# Patient Record
Sex: Female | Born: 1935 | Race: White | Hispanic: No | Marital: Married | Smoking: Never smoker
Health system: Southern US, Community
[De-identification: ages and names within clinical notes are randomized; demographics above are authoritative.]

## PROBLEM LIST (undated history)

## (undated) DIAGNOSIS — K5792 Diverticulitis of intestine, part unspecified, without perforation or abscess without bleeding: Secondary | ICD-10-CM

## (undated) DIAGNOSIS — G459 Transient cerebral ischemic attack, unspecified: Secondary | ICD-10-CM

## (undated) DIAGNOSIS — I739 Peripheral vascular disease, unspecified: Secondary | ICD-10-CM

## (undated) DIAGNOSIS — H4450 Unspecified degenerated conditions of globe: Secondary | ICD-10-CM

## (undated) DIAGNOSIS — I10 Essential (primary) hypertension: Secondary | ICD-10-CM

## (undated) DIAGNOSIS — H9190 Unspecified hearing loss, unspecified ear: Secondary | ICD-10-CM

## (undated) DIAGNOSIS — M810 Age-related osteoporosis without current pathological fracture: Secondary | ICD-10-CM

## (undated) HISTORY — PX: OTHER SURGICAL HISTORY: SHX169

---

## 2004-04-08 ENCOUNTER — Ambulatory Visit: Payer: Self-pay

## 2004-10-01 ENCOUNTER — Ambulatory Visit: Payer: Self-pay | Admitting: Internal Medicine

## 2004-10-07 ENCOUNTER — Ambulatory Visit: Payer: Self-pay | Admitting: Internal Medicine

## 2005-10-04 ENCOUNTER — Ambulatory Visit: Payer: Self-pay | Admitting: Internal Medicine

## 2006-10-07 ENCOUNTER — Ambulatory Visit: Payer: Self-pay | Admitting: Internal Medicine

## 2007-06-01 ENCOUNTER — Emergency Department: Payer: Self-pay | Admitting: Internal Medicine

## 2007-06-02 ENCOUNTER — Ambulatory Visit: Payer: Self-pay

## 2007-07-07 ENCOUNTER — Other Ambulatory Visit: Payer: Self-pay

## 2007-07-07 ENCOUNTER — Inpatient Hospital Stay: Payer: Self-pay | Admitting: Internal Medicine

## 2007-10-09 ENCOUNTER — Ambulatory Visit: Payer: Self-pay | Admitting: Internal Medicine

## 2008-11-04 ENCOUNTER — Ambulatory Visit: Payer: Self-pay | Admitting: Internal Medicine

## 2010-12-29 ENCOUNTER — Encounter: Payer: Self-pay | Admitting: Cardiothoracic Surgery

## 2010-12-29 ENCOUNTER — Encounter: Payer: Self-pay | Admitting: Nurse Practitioner

## 2011-01-06 ENCOUNTER — Encounter: Payer: Self-pay | Admitting: Nurse Practitioner

## 2011-01-06 ENCOUNTER — Encounter: Payer: Self-pay | Admitting: Cardiothoracic Surgery

## 2011-02-06 ENCOUNTER — Encounter: Payer: Self-pay | Admitting: Cardiothoracic Surgery

## 2011-02-06 ENCOUNTER — Encounter: Payer: Self-pay | Admitting: Nurse Practitioner

## 2011-07-05 ENCOUNTER — Ambulatory Visit: Payer: Self-pay | Admitting: Internal Medicine

## 2012-06-29 ENCOUNTER — Encounter: Payer: Self-pay | Admitting: Cardiothoracic Surgery

## 2012-06-29 ENCOUNTER — Encounter: Payer: Self-pay | Admitting: Nurse Practitioner

## 2012-07-08 ENCOUNTER — Encounter: Payer: Self-pay | Admitting: Cardiothoracic Surgery

## 2012-07-08 ENCOUNTER — Encounter: Payer: Self-pay | Admitting: Nurse Practitioner

## 2012-07-30 LAB — MISC AER/ANAEROBIC CULT.

## 2012-08-05 ENCOUNTER — Encounter: Payer: Self-pay | Admitting: Nurse Practitioner

## 2012-08-05 ENCOUNTER — Encounter: Payer: Self-pay | Admitting: Cardiothoracic Surgery

## 2012-09-05 ENCOUNTER — Encounter: Payer: Self-pay | Admitting: Nurse Practitioner

## 2012-09-05 ENCOUNTER — Encounter: Payer: Self-pay | Admitting: Cardiothoracic Surgery

## 2012-09-19 LAB — COMPREHENSIVE METABOLIC PANEL
Albumin: 3.2 g/dL — ABNORMAL LOW (ref 3.4–5.0)
Alkaline Phosphatase: 60 U/L (ref 50–136)
Calcium, Total: 9.1 mg/dL (ref 8.5–10.1)
Co2: 25 mmol/L (ref 21–32)
EGFR (African American): >60
Glucose: 95 mg/dL (ref 65–99)
Osmolality: 270 (ref 275–301)
SGOT(AST): 27 U/L (ref 15–37)
SGPT (ALT): 19 U/L (ref 12–78)
Sodium: 136 mmol/L (ref 136–145)

## 2012-09-19 LAB — URINALYSIS, COMPLETE
Blood: NEGATIVE
Glucose,UR: NEGATIVE mg/dL (ref 0–75)
Hyaline Cast: 3
Protein: 30
RBC,UR: 8 /HPF (ref 0–5)
Squamous Epithelial: 11
WBC UR: 7 /HPF (ref 0–5)

## 2012-09-19 LAB — CBC WITH DIFFERENTIAL/PLATELET
Basophil %: 0.7 %
Eosinophil #: 0 10*3/uL (ref 0.0–0.7)
Lymphocyte %: 20.7 %
MCHC: 34 g/dL (ref 32.0–36.0)
Neutrophil #: 3.3 10*3/uL (ref 1.4–6.5)
Platelet: 119 10*3/uL — ABNORMAL LOW (ref 150–440)
RBC: 4.11 10*6/uL (ref 3.80–5.20)
WBC: 5.1 10*3/uL (ref 3.6–11.0)

## 2012-09-19 LAB — PROTIME-INR
INR: 2.1
Prothrombin Time: 23.2 secs — ABNORMAL HIGH (ref 11.5–14.7)

## 2012-09-19 LAB — TROPONIN I: Troponin-I: 0.02 ng/mL

## 2012-09-19 LAB — TSH: Thyroid Stimulating Horm: 3.38 u[IU]/mL

## 2012-09-20 LAB — COMPREHENSIVE METABOLIC PANEL
Alkaline Phosphatase: 56 U/L (ref 50–136)
Anion Gap: 6 — ABNORMAL LOW (ref 7–16)
BUN: 8 mg/dL (ref 7–18)
Bilirubin,Total: 0.7 mg/dL (ref 0.2–1.0)
Calcium, Total: 8.7 mg/dL (ref 8.5–10.1)
Co2: 28 mmol/L (ref 21–32)
Creatinine: 0.88 mg/dL (ref 0.60–1.30)
EGFR (African American): >60
Glucose: 85 mg/dL (ref 65–99)
Osmolality: 268 (ref 275–301)
Potassium: 4 mmol/L (ref 3.5–5.1)
SGOT(AST): 32 U/L (ref 15–37)
SGPT (ALT): 22 U/L (ref 12–78)
Sodium: 135 mmol/L — ABNORMAL LOW (ref 136–145)

## 2012-09-20 LAB — CBC WITH DIFFERENTIAL/PLATELET
Basophil %: 0.5 %
Eosinophil #: 0 10*3/uL (ref 0.0–0.7)
Eosinophil %: 0.3 %
HGB: 13.5 g/dL (ref 12.0–16.0)
Lymphocyte #: 0.7 10*3/uL — ABNORMAL LOW (ref 1.0–3.6)
MCH: 32.5 pg (ref 26.0–34.0)
MCV: 96 fL (ref 80–100)
Monocyte #: 0.8 x10 3/mm (ref 0.2–0.9)
Neutrophil #: 2.4 10*3/uL (ref 1.4–6.5)
Platelet: 113 10*3/uL — ABNORMAL LOW (ref 150–440)
RBC: 4.16 10*6/uL (ref 3.80–5.20)
RDW: 14.1 % (ref 11.5–14.5)
WBC: 3.9 10*3/uL (ref 3.6–11.0)

## 2012-09-21 ENCOUNTER — Inpatient Hospital Stay: Payer: Self-pay | Admitting: Internal Medicine

## 2012-09-21 LAB — PROTIME-INR
INR: 1.6
Prothrombin Time: 18.6 secs — ABNORMAL HIGH (ref 11.5–14.7)

## 2012-09-21 LAB — URINE CULTURE

## 2012-09-22 LAB — PROTIME-INR: Prothrombin Time: 17.2 secs — ABNORMAL HIGH (ref 11.5–14.7)

## 2012-09-23 LAB — CBC WITH DIFFERENTIAL/PLATELET
Basophil #: 0 10*3/uL (ref 0.0–0.1)
Basophil %: 0.8 %
HCT: 41.3 % (ref 35.0–47.0)
HGB: 14.2 g/dL (ref 12.0–16.0)
Lymphocyte #: 0.9 10*3/uL — ABNORMAL LOW (ref 1.0–3.6)
Lymphocyte %: 26.9 %
Monocyte #: 0.5 x10 3/mm (ref 0.2–0.9)
RBC: 4.37 10*6/uL (ref 3.80–5.20)

## 2012-09-23 LAB — BASIC METABOLIC PANEL
Anion Gap: 8 (ref 7–16)
BUN: 11 mg/dL (ref 7–18)
Chloride: 105 mmol/L (ref 98–107)
Co2: 27 mmol/L (ref 21–32)
Creatinine: 0.84 mg/dL (ref 0.60–1.30)
EGFR (Non-African Amer.): >60
Glucose: 104 mg/dL — ABNORMAL HIGH (ref 65–99)
Potassium: 3.4 mmol/L — ABNORMAL LOW (ref 3.5–5.1)

## 2012-09-23 LAB — PROTIME-INR: Prothrombin Time: 20.7 secs — ABNORMAL HIGH (ref 11.5–14.7)

## 2012-09-24 LAB — BASIC METABOLIC PANEL
Anion Gap: 6 — ABNORMAL LOW (ref 7–16)
BUN: 8 mg/dL (ref 7–18)
Chloride: 106 mmol/L (ref 98–107)
Creatinine: 0.74 mg/dL (ref 0.60–1.30)
EGFR (African American): >60
EGFR (Non-African Amer.): >60
Glucose: 85 mg/dL (ref 65–99)
Osmolality: 273 (ref 275–301)
Potassium: 3.6 mmol/L (ref 3.5–5.1)
Sodium: 138 mmol/L (ref 136–145)

## 2012-09-24 LAB — PROTIME-INR
INR: 2.5
Prothrombin Time: 26.6 secs — ABNORMAL HIGH (ref 11.5–14.7)

## 2012-09-24 LAB — CBC WITH DIFFERENTIAL/PLATELET
Basophil #: 0 10*3/uL (ref 0.0–0.1)
Basophil %: 0.8 %
HGB: 13.8 g/dL (ref 12.0–16.0)
Lymphocyte #: 1 10*3/uL (ref 1.0–3.6)
Lymphocyte %: 38.2 %
MCHC: 33.9 g/dL (ref 32.0–36.0)
MCV: 94 fL (ref 80–100)
Monocyte #: 0.4 x10 3/mm (ref 0.2–0.9)
Neutrophil #: 1.1 10*3/uL — ABNORMAL LOW (ref 1.4–6.5)
Neutrophil %: 40 %
Platelet: 123 10*3/uL — ABNORMAL LOW (ref 150–440)
RBC: 4.33 10*6/uL (ref 3.80–5.20)
RDW: 14 % (ref 11.5–14.5)
WBC: 2.7 10*3/uL — ABNORMAL LOW (ref 3.6–11.0)

## 2012-09-25 LAB — PROTIME-INR
INR: 3.6
Prothrombin Time: 34.7 secs — ABNORMAL HIGH (ref 11.5–14.7)

## 2012-09-25 LAB — CULTURE, BLOOD (SINGLE)

## 2012-09-26 LAB — PROTIME-INR: Prothrombin Time: 36.9 secs — ABNORMAL HIGH (ref 11.5–14.7)

## 2013-10-17 ENCOUNTER — Encounter: Payer: Self-pay | Admitting: Surgery

## 2013-11-05 ENCOUNTER — Encounter: Payer: Self-pay | Admitting: Surgery

## 2013-11-12 ENCOUNTER — Encounter: Payer: Self-pay | Admitting: Surgery

## 2014-09-27 NOTE — Discharge Summary (Signed)
PATIENT NAME:  Ashley Todd, Ashley Todd MR#:  [id number] DATE OF BIRTH:  06-04-1919  DATE OF ADMISSION:  09/21/2012 DATE OF DISCHARGE:  09/26/2012  DISCHARGE DIAGNOSES: 1.  Encephalopathy, secondary to urinary tract infection.  2.  Chronic atrial fibrillation.  3.  Osteoporosis.  4.  History of embolic stroke.  5.  Urinary incontinence.   DISCHARGE MEDICATIONS: 1.  Coumadin 2 mg at bedtime.  2.  Digoxin 125 mcg every other day.  3.  Tylenol 650 mg q.4 p.r.n., pain.  4.  Vitamin D3 1000 units daily.  5.  Zantac 150 mg b.i.d.   REASON FOR ADMISSION: A 81 year old female presents with altered mental status and a urinary tract infection. Please see H and P for HPI, past medical history, and physical exam.   HOSPITAL COURSE: The patient was admitted. MRI was performed which showed her old stroke, but not a new stroke. She was given IV Rocephin for a UTI. Over time her mentation improved somewhat. She does have a baseline mild dementia, which was worsened being in the hospital setting. She remained somewhat weak and unstable. Her nausea improved by going to q.o.d. on the digoxin and stopping her atenolol. She does have some reflux, treated with Zantac. She will be going to a skilled nursing facility. Overall prognosis is guarded.   Currently her INR is 2.9 on 5 mg of Coumadin. Coumadin is being held. Resume 2 mg Coumadin in 3 days, with INR protime check needed in 7 days.    ____________________________ Danella PentonMark F. Miller, MD [id number] D: 09/26/2012 07:45:30 ET T: 09/26/2012 07:57:05 ET JOB#: [id number]  cc: Danella PentonMark F. Miller, MD, <Dictator> Danella PentonMARK F MILLER MD ELECTRONICALLY SIGNED 09/27/2012 8:01

## 2014-09-27 NOTE — H&P (Signed)
PATIENT NAME:  Ashley Todd, Ashley Todd MR#:  [id number] DATE OF BIRTH:  10-10-1918  DATE OF ADMISSION:  09/19/2012  CHIEF COMPLAINT: Confusion.   HISTORY OF PRESENT ILLNESS: This is a 81 year old year-old female brought in by her son for confusion intermittently for a week. It has happened at least 2 days now where she is completely confused, which she typically is not. She does speak, but nonsensical ideas or syntax to her sentences seem to be present. She had been worked up in the Emergency Room including CT scans of the abdomen and chest and head. She had a mild to moderately abnormal urinalysis, but otherwise her workup was normal. She has also had some nausea, which was present in late March, seeing her primary care doctor, Dr. Hyacinth MeekerMiller, as well. Apparently she was not confused at that point. She otherwise cannot give any further history. Denies any pain.  REVIEW OF SYSTEMS: Cannot do as noted above.   PAST MEDICAL HISTORY:   1.  Hypertension.  2.  History of tachycardia.  3.  Urinary incontinence.  4.  Osteoporosis.  5.  Diverticulosis.  6.  Chronic A. fib. 7.  History of embolic stroke.   PAST SURGICAL HISTORY: Hysterectomy, hip replacement, bilateral cataracts.   MEDICATIONS: [facility] Coumadin 5 mg daily, torsemide 10 mg daily, potassium 8 mEq daily, vitamin D3 at 1000 international units daily, Ocuvite, tramadol p.r.n., digoxin 125 mcg daily, atenolol daily, hydrochlorothiazide.   FAMILY HISTORY: Sister with hypertension. Brother with CABG.   SOCIAL HISTORY: No tobacco or alcohol. She is married. Has a son who is with her. Walked 3 miles a day in the past.  PHYSICAL EXAMINATION:   VITAL SIGNS: Pulse is approximately 75 and irregular, respirations 16. She is afebrile. Blood pressure 180/92 on presentation.  HEENT: TMs clear. OP clear. Pupils equal and reactive to light. NECK: No bruits, thyromegaly, adenopathy or JVD.  CHEST: Clear to auscultation and  percussion. HEART: Irregularly irregular. No murmurs or gallops. ABDOMEN: Soft, flat, nontender. No hepatosplenomegaly, bruits or masses. EXTREMITIES: No clubbing, cyanosis or edema. NEUROLOGIC: Grossly nonfocal with movement of all extremities. Good sensation in extremities. Reflexes intact, although she is confused as noted above.  LABORATORY AND RADIOLOGIC DATA: CT scans as noted above. She has a urinalysis with 8 red cells per high-power field, 7 whites per high-power field. Unremarkable CBC. TSH, troponins normal. Comprehensive metabolic panel shows albumin of 3.2, GFR of 57, otherwise normal.  ASSESSMENT AND PLAN: Encephalopathy: Unclear cause. Rule out stroke. Rule out urinary tract infection. Will give her some intravenous fluids for now. Check a pro time and restart her Coumadin if that level is reasonable. Get a digoxin level as well with her nausea, though one was normal fairly recently as an outpatient. Give her Levaquin for the possible urinary tract infection and co response to treatment. Further workup and evaluation dependent upon her progress.    ____________________________ Marya AmslerMarshall W. Dareen PianoAnderson, MD mwa:jm D: 09/19/2012 19:01:49 ET T: 09/19/2012 19:46:38 ET JOB#: [id number]  cc: Marya AmslerMarshall W. Dareen PianoAnderson, MD, <Dictator> Lauro RegulusMARSHALL W Kameo Bains MD ELECTRONICALLY SIGNED 09/20/2012 8:08

## 2014-10-29 ENCOUNTER — Other Ambulatory Visit
Admission: RE | Admit: 2014-10-29 | Discharge: 2014-10-29 | Disposition: A | Discharge status: Home / Self Care | Payer: Medicare Other | Source: Ambulatory Visit | Attending: Podiatry | Admitting: Podiatry

## 2014-10-29 DIAGNOSIS — L97529 Non-pressure chronic ulcer of other part of left foot with unspecified severity: Secondary | ICD-10-CM | POA: Insufficient documentation

## 2014-11-02 LAB — ANAEROBIC CULTURE

## 2014-11-02 LAB — WOUND CULTURE

## 2014-11-08 IMAGING — CT CT HEAD WITHOUT CONTRAST
1 series · 16 of 29 positions shown, 20 images · non-contrast
Comparison: none

REASON FOR EXAM: weak ams head "[id number]"
COMMENTS:

PROCEDURE:     CT  - CT HEAD WITHOUT CONTRAST  - September 19, 2012  [DATE]
RESULT:     Technique: Helical noncontrasted 5 mm sections were obtained
from the skull base through the vertex.

[Series 2: soft tissue · axial · 0.39mm/px · z∈[+397,+527]mm · 16 of 29 slices shown, 20 images]
[im 2/29  brain]
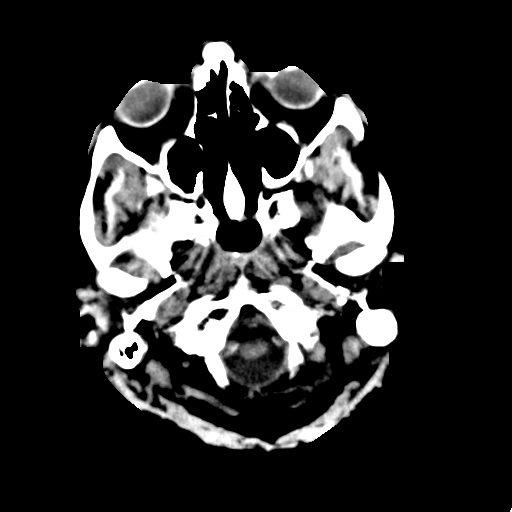
[im 2/29  bone]
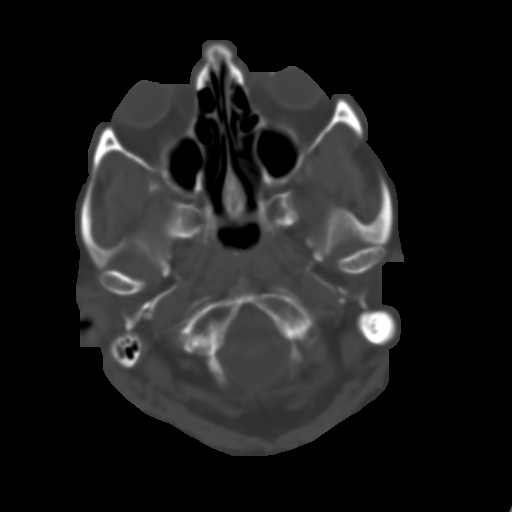
[im 4/29  brain]
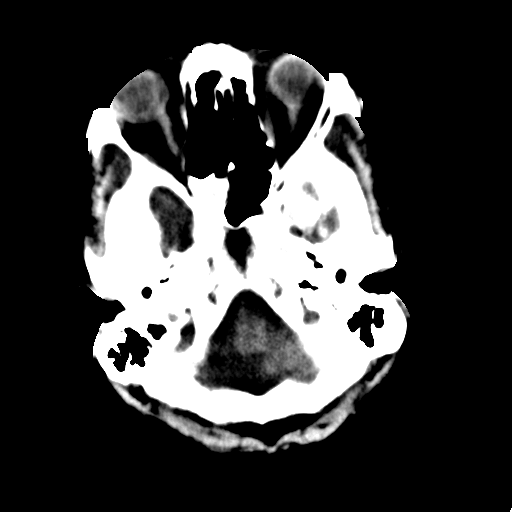
[im 6/29  brain]
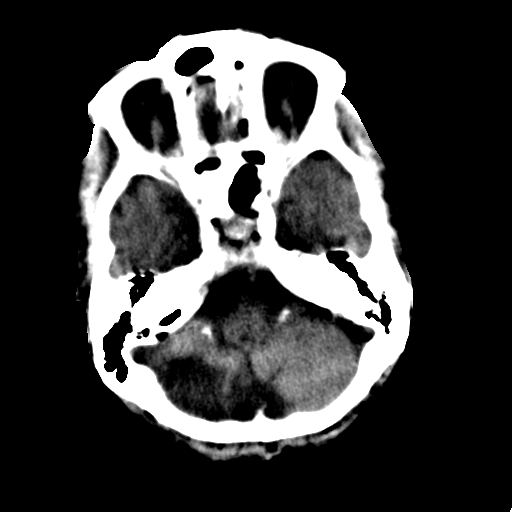
[im 7/29  brain]
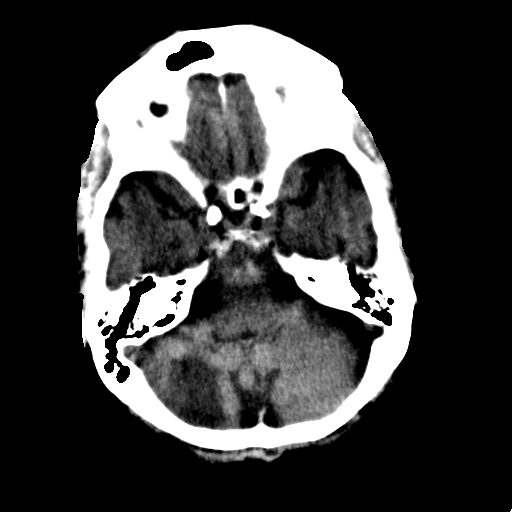
[im 9/29  brain]
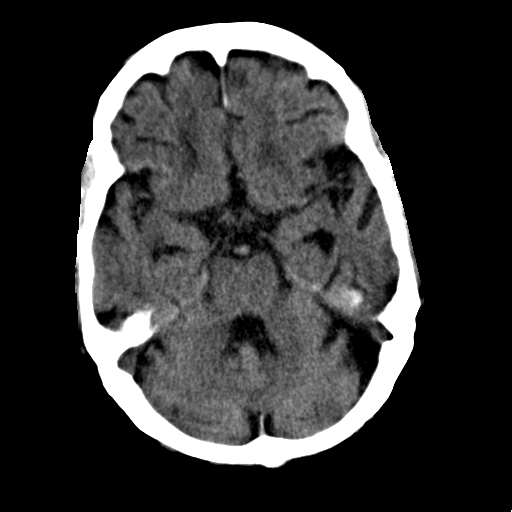
[im 9/29  bone]
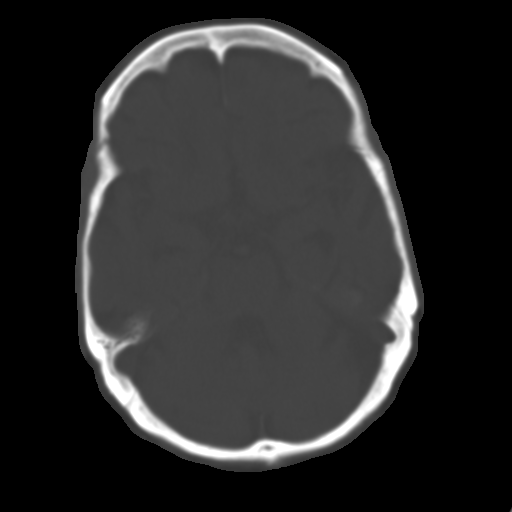
[im 11/29  brain]
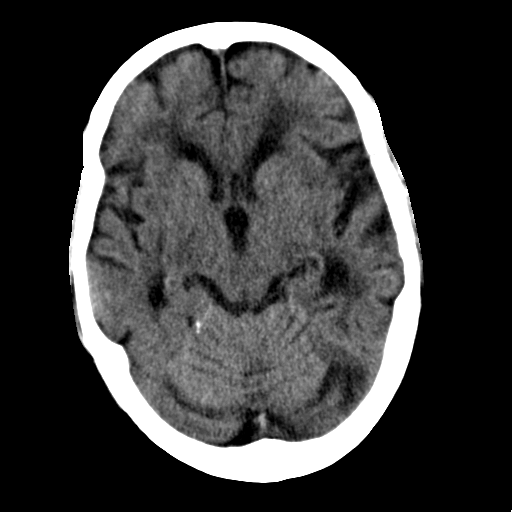
[im 12/29  brain]
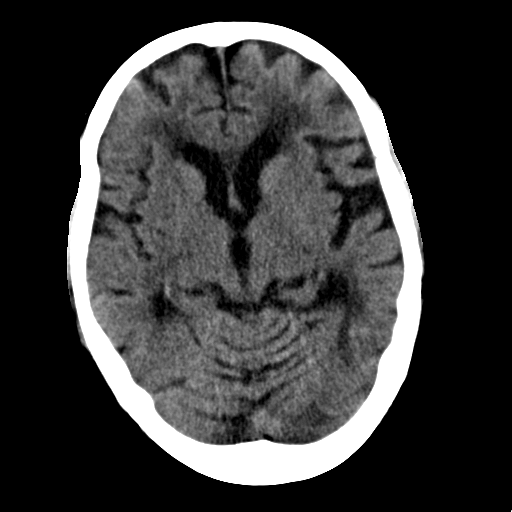
[im 14/29  brain]
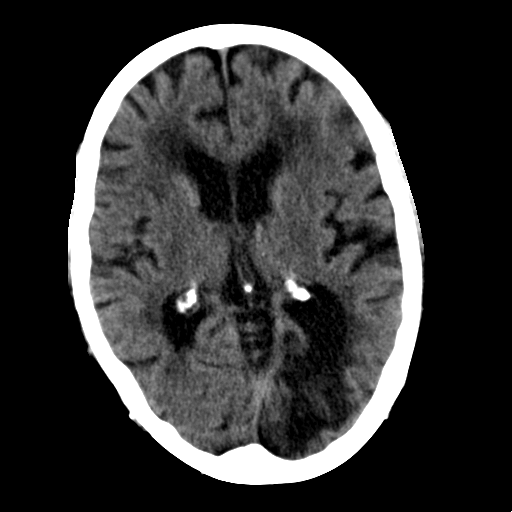
[im 16/29  brain]
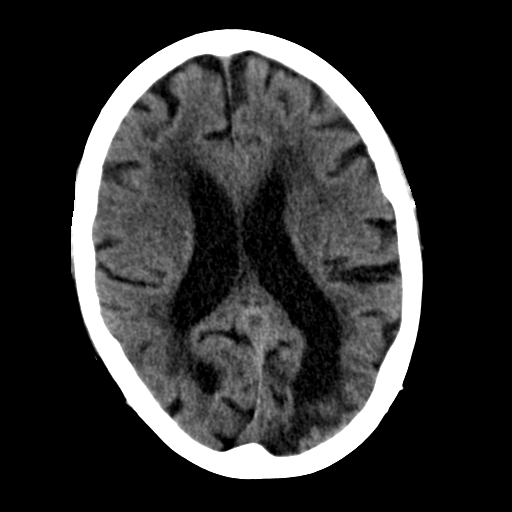
[im 16/29  bone]
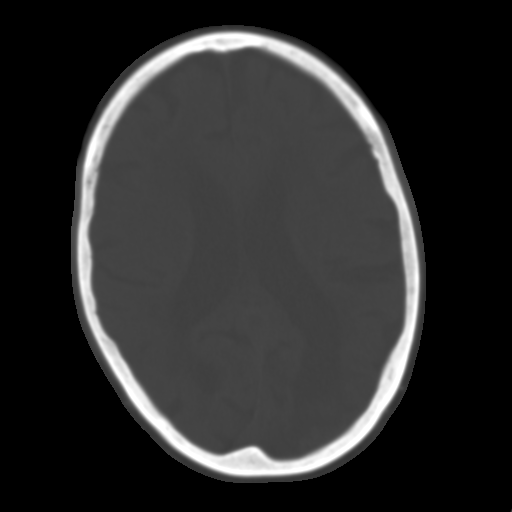
[im 18/29  brain]
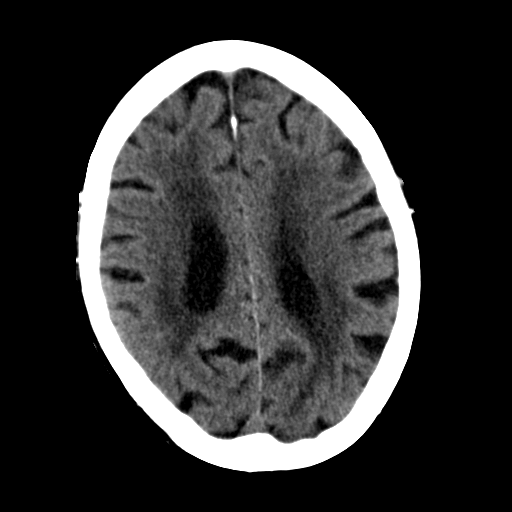
[im 19/29  brain]
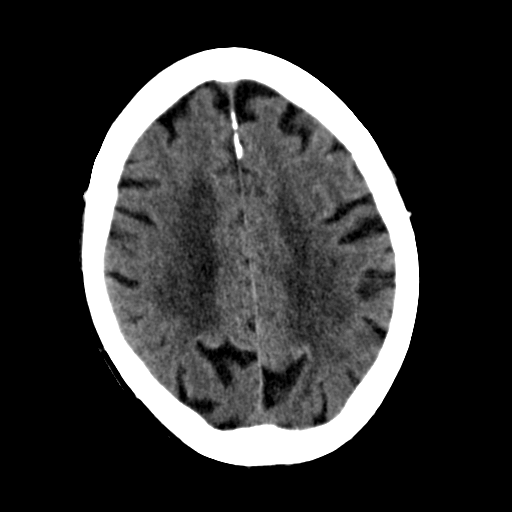
[im 21/29  brain]
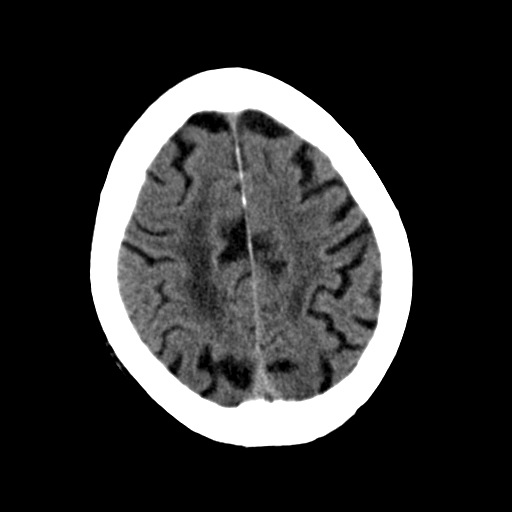
[im 23/29  brain]
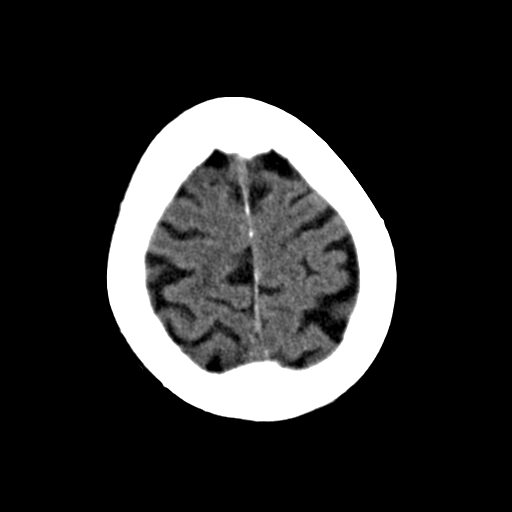
[im 23/29  bone]
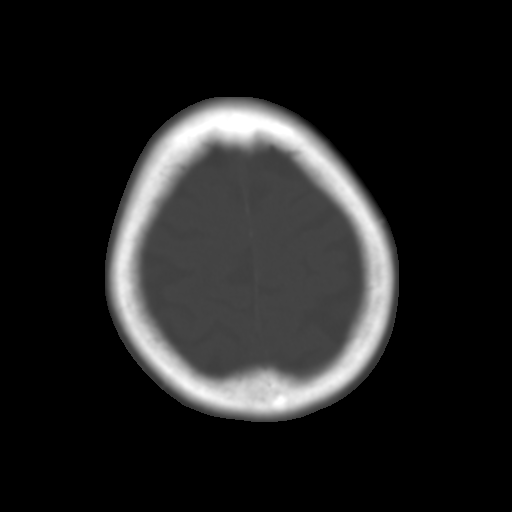
[im 24/29  brain]
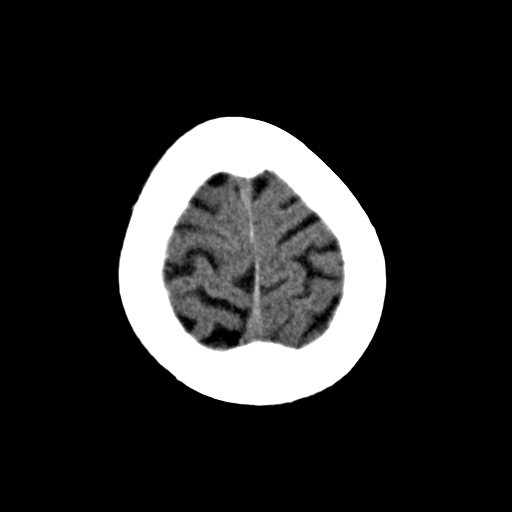
[im 26/29  brain]
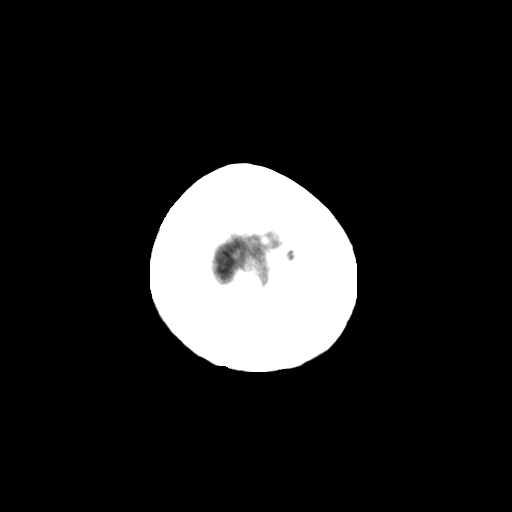
[im 28/29  brain]
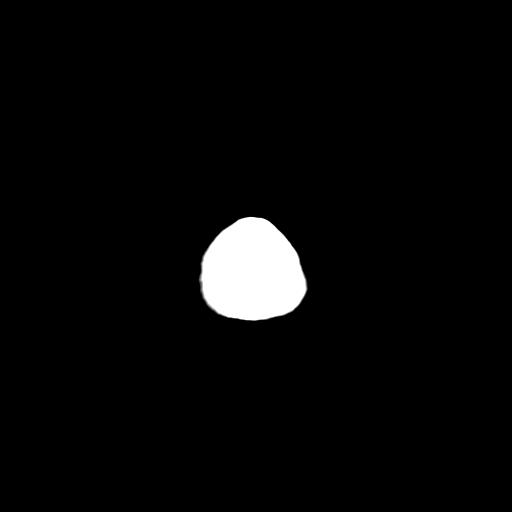

[16 of 29 positions shown; findings below may reference images not displayed]

FINDINGS: Diffuse cortical and cerebellar atrophy is identified as well as
diffuse areas of low attenuation within the subcortical, deep and
periventricular white matter regions. There is not evidence of intra-axial
nor extra-axial fluid collections, acute hemorrhage, mass effect, nor a
depressed skull fracture. The visualized paranasal sinuses and mastoid air
cells are patent.

Encephalomalacic changes appreciated near completely encompassing the right
lobe of the cerebellum.
IMPRESSION: Chronic and involutional changes without evidence of acute
abnormalities. If there is persistent clinical concern further evaluation
with MRI is recommended.

## 2014-11-09 IMAGING — US ABDOMEN ULTRASOUND LIMITED
1 series · 14 of 25 positions shown · non-contrast
Comparison: none

REASON FOR EXAM: progressive nausea
COMMENTS:   Body Site: GB and Fossa, CBD, Head of Pancreas

PROCEDURE:     US  - US ABDOMEN LIMITED SURVEY  - September 20, 2012  [DATE]
RESULT:

[Series 1: abdomen ultrasound limited · 0.25mm/px · 14 of 47 slices shown]
[im 1/47]
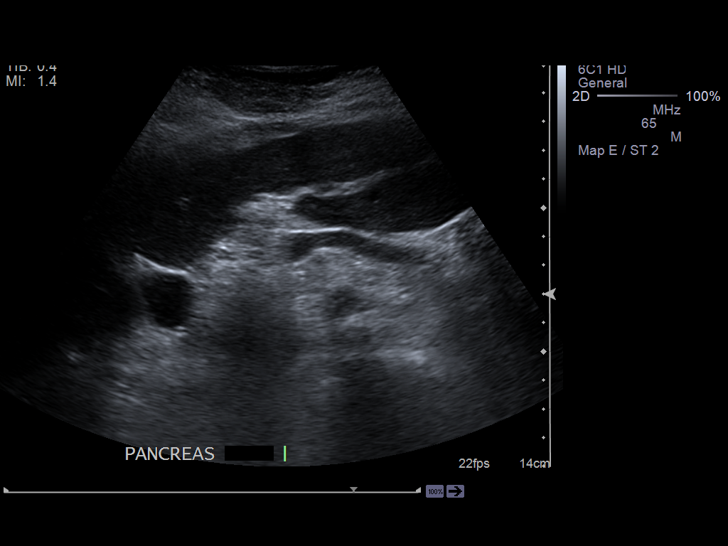
[im 4/47]
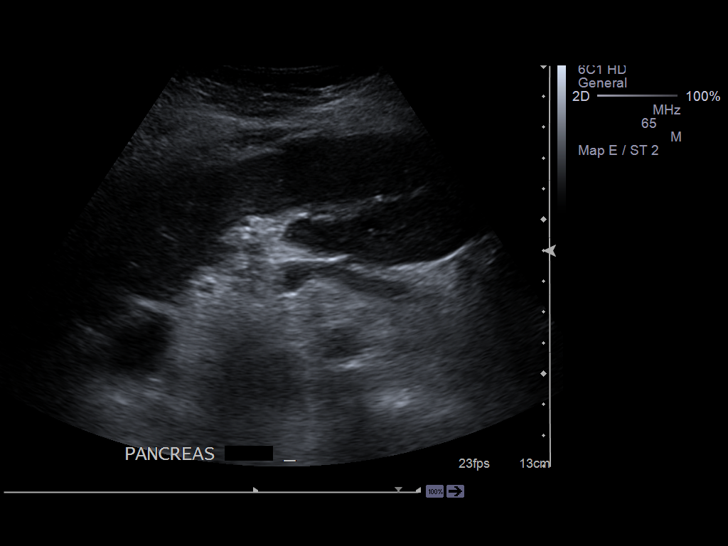
[im 8/47]
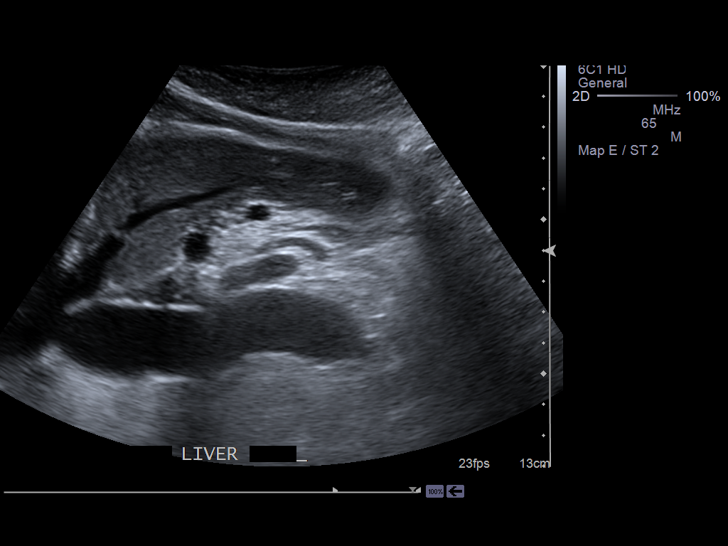
[im 12/47]
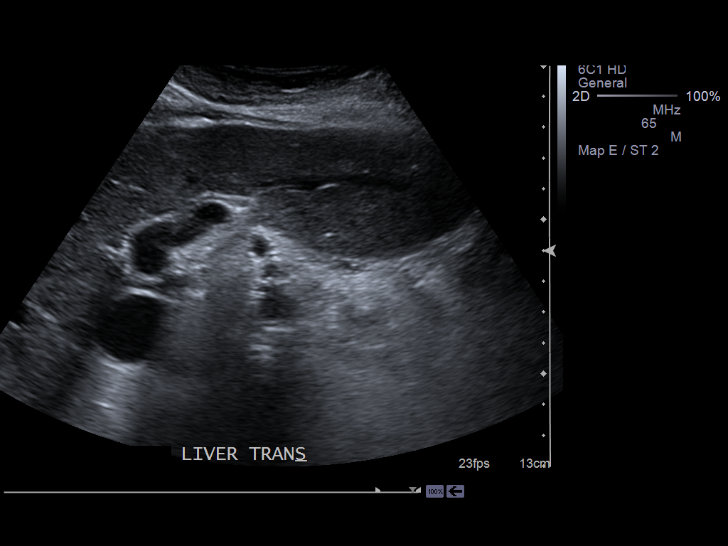
[im 16/47]
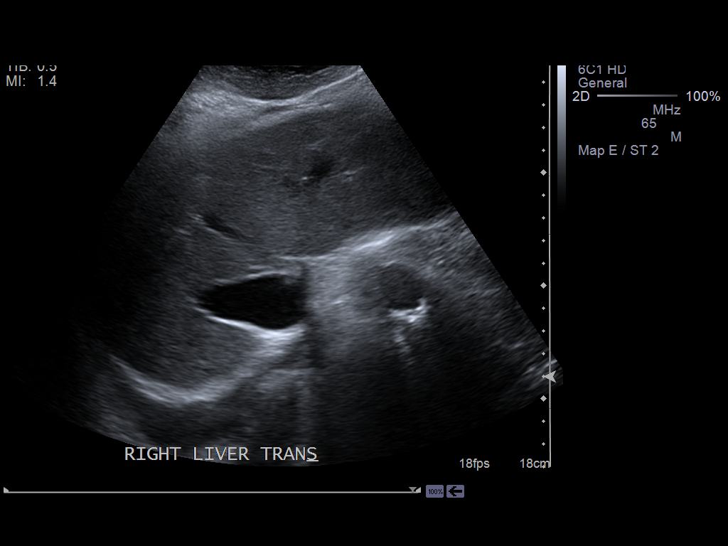
[im 18/47]
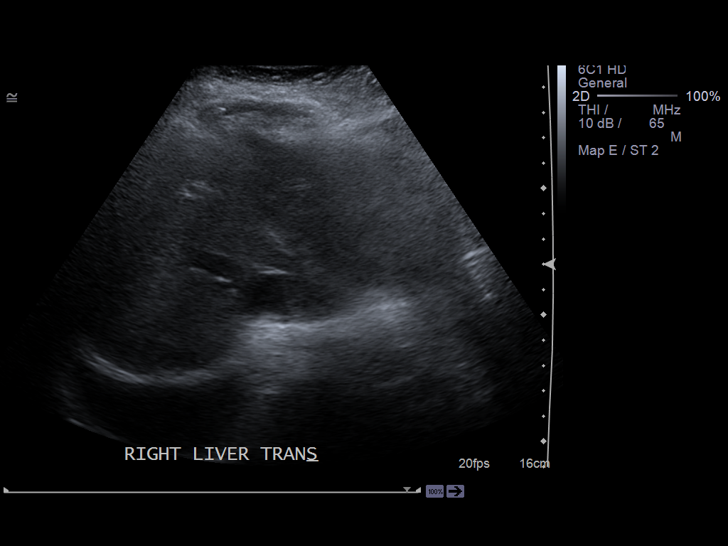
[im 22/47]
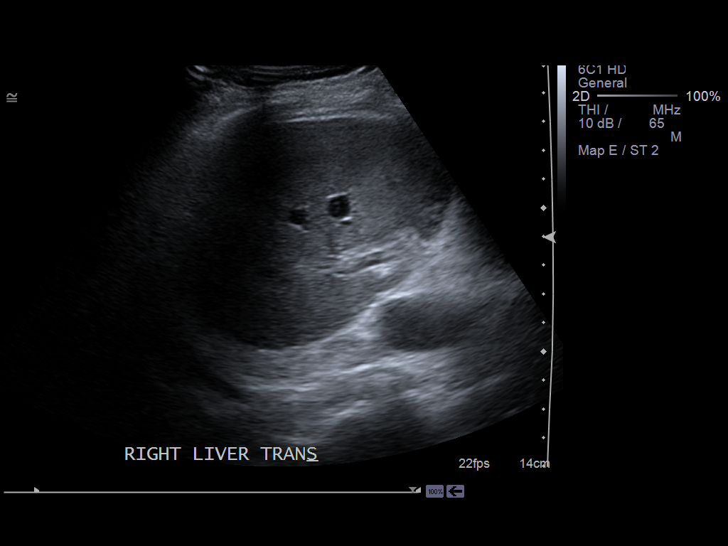
[im 25/47]
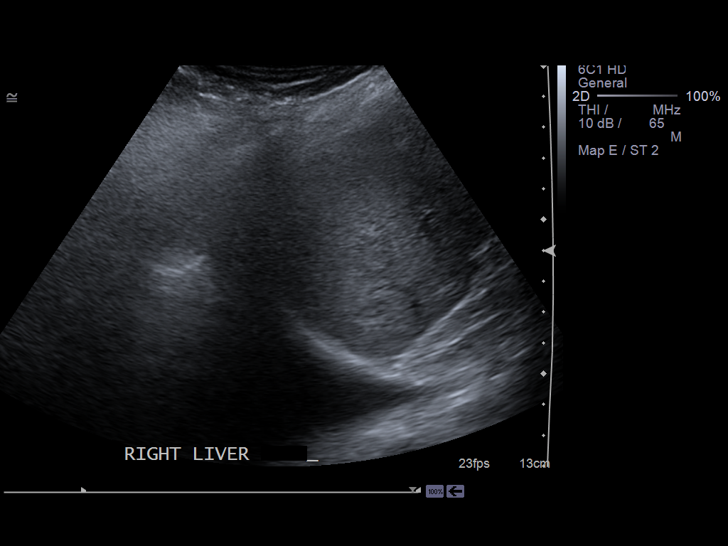
[im 29/47]
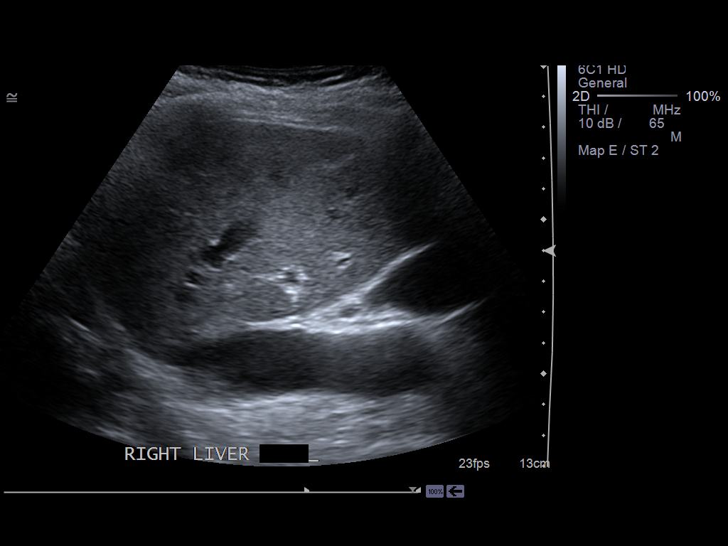
[im 31/47]
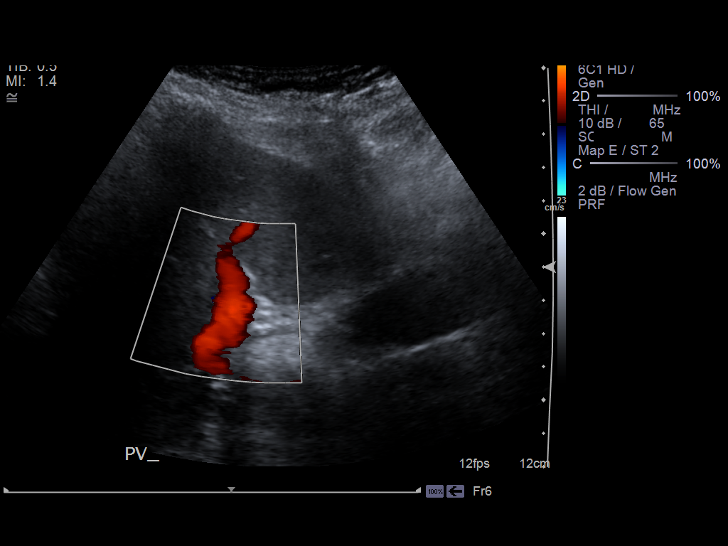
[im 35/47]
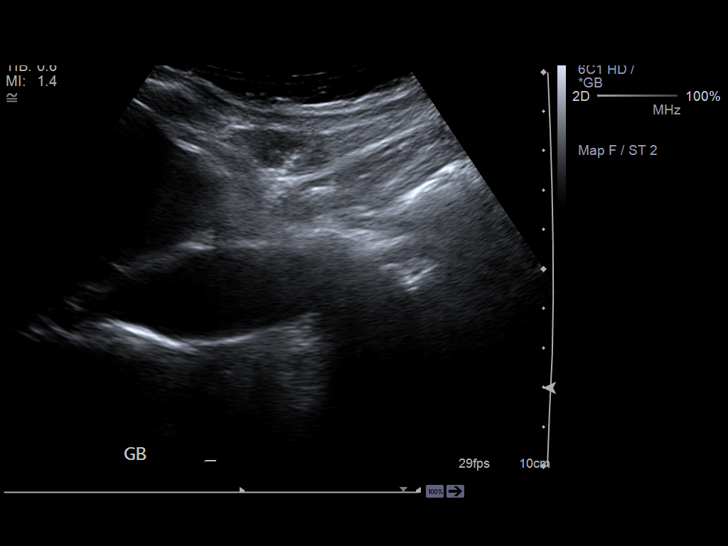
[im 39/47]
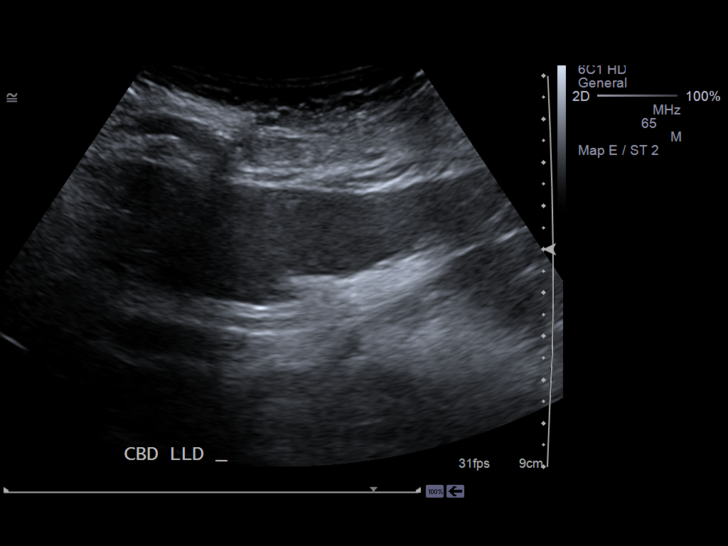
[im 43/47]
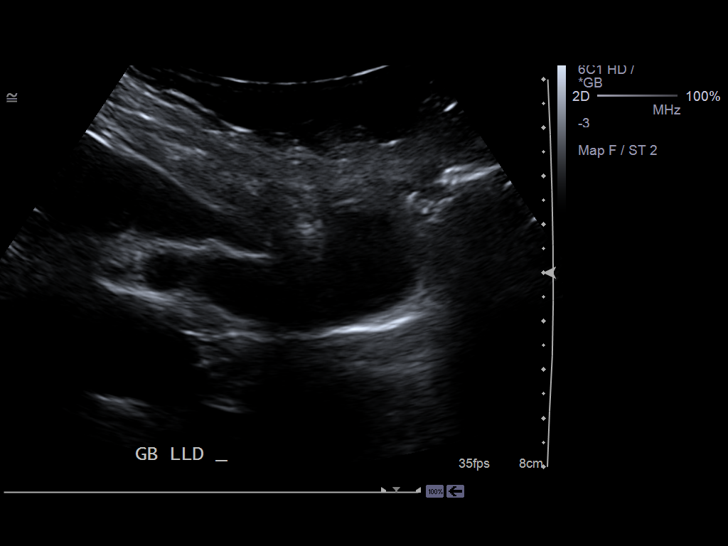
[im 47/47]
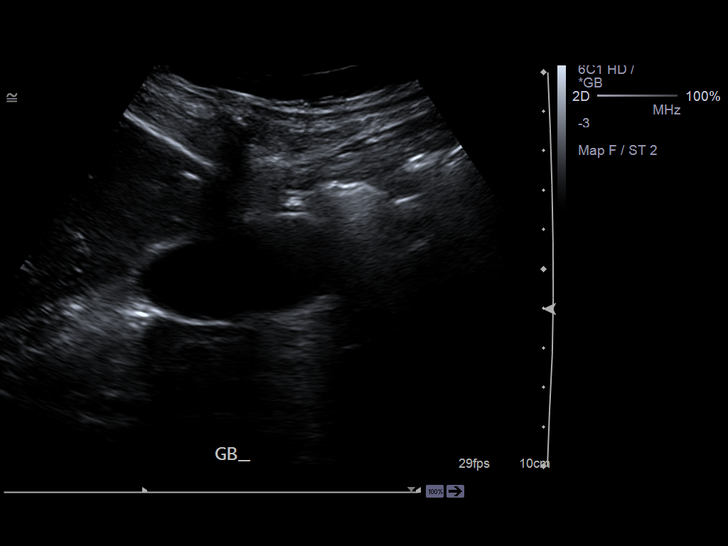

[14 of 25 positions shown; findings below may reference images not displayed]

FINDINGS: The liver demonstrates a homogeneous echotexture and measures
11.32 cm in longitudinal dimensions. Hepatopetal flow is identified within
the portal vein. The pancreas is unremarkable.

The gallbladder fossa demonstrates no evidence of pericholecystic fluid,
gallstones, sludging, nor a sonographic Murphy's sign. Gallbladder wall
thickness is 1.7 mm and the common bile duct measures 2.7 mm in diameter. A
right-sided pleural effusion is visualized.
IMPRESSION: 1. Right pleural effusion, otherwise unremarkable right upper quadrant
abdominal ultrasound.

## 2015-05-14 ENCOUNTER — Other Ambulatory Visit: Payer: Self-pay | Admitting: Vascular Surgery

## 2015-05-14 ENCOUNTER — Other Ambulatory Visit: Payer: Self-pay | Admitting: Infectious Diseases

## 2015-05-16 ENCOUNTER — Other Ambulatory Visit
Admission: RE | Admit: 2015-05-16 | Discharge: 2015-05-16 | Disposition: A | Discharge status: Home / Self Care | Payer: Medicare Other | Source: Ambulatory Visit | Attending: Vascular Surgery | Admitting: Vascular Surgery

## 2015-05-16 DIAGNOSIS — Z029 Encounter for administrative examinations, unspecified: Secondary | ICD-10-CM | POA: Insufficient documentation

## 2015-05-16 LAB — CREATININE, SERUM: Creatinine, Ser: 0.79 mg/dL (ref 0.44–1.00)

## 2015-05-16 LAB — BUN: BUN: 22 mg/dL — AB (ref 6–20)

## 2015-05-19 ENCOUNTER — Encounter
Admission: RE | Disposition: A | Discharge status: Home / Self Care | Payer: Self-pay | Source: Ambulatory Visit | Attending: Vascular Surgery

## 2015-05-19 ENCOUNTER — Ambulatory Visit
Admission: RE | Admit: 2015-05-19 | Discharge: 2015-05-19 | Disposition: A | Discharge status: Home / Self Care | Payer: Medicare Other | Source: Ambulatory Visit | Attending: Vascular Surgery | Admitting: Vascular Surgery

## 2015-05-19 ENCOUNTER — Encounter: Payer: Self-pay | Admitting: *Deleted

## 2015-05-19 DIAGNOSIS — I10 Essential (primary) hypertension: Secondary | ICD-10-CM | POA: Insufficient documentation

## 2015-05-19 DIAGNOSIS — K5790 Diverticulosis of intestine, part unspecified, without perforation or abscess without bleeding: Secondary | ICD-10-CM | POA: Diagnosis not present

## 2015-05-19 DIAGNOSIS — I482 Chronic atrial fibrillation: Secondary | ICD-10-CM | POA: Insufficient documentation

## 2015-05-19 DIAGNOSIS — Z79899 Other long term (current) drug therapy: Secondary | ICD-10-CM | POA: Insufficient documentation

## 2015-05-19 DIAGNOSIS — Z8673 Personal history of transient ischemic attack (TIA), and cerebral infarction without residual deficits: Secondary | ICD-10-CM | POA: Diagnosis not present

## 2015-05-19 DIAGNOSIS — M810 Age-related osteoporosis without current pathological fracture: Secondary | ICD-10-CM | POA: Diagnosis not present

## 2015-05-19 DIAGNOSIS — Z7901 Long term (current) use of anticoagulants: Secondary | ICD-10-CM | POA: Diagnosis not present

## 2015-05-19 DIAGNOSIS — L97529 Non-pressure chronic ulcer of other part of left foot with unspecified severity: Secondary | ICD-10-CM | POA: Diagnosis not present

## 2015-05-19 DIAGNOSIS — I70245 Atherosclerosis of native arteries of left leg with ulceration of other part of foot: Secondary | ICD-10-CM | POA: Insufficient documentation

## 2015-05-19 DIAGNOSIS — I499 Cardiac arrhythmia, unspecified: Secondary | ICD-10-CM | POA: Diagnosis not present

## 2015-05-19 HISTORY — DX: Diverticulitis of intestine, part unspecified, without perforation or abscess without bleeding: K57.92

## 2015-05-19 HISTORY — DX: Unspecified degenerated conditions of globe: H44.50

## 2015-05-19 HISTORY — PX: PERIPHERAL VASCULAR CATHETERIZATION: SHX172C

## 2015-05-19 HISTORY — DX: Transient cerebral ischemic attack, unspecified: G45.9

## 2015-05-19 HISTORY — DX: Unspecified hearing loss, unspecified ear: H91.90

## 2015-05-19 HISTORY — DX: Essential (primary) hypertension: I10

## 2015-05-19 HISTORY — DX: Peripheral vascular disease, unspecified: I73.9

## 2015-05-19 HISTORY — DX: Age-related osteoporosis without current pathological fracture: M81.0

## 2015-05-19 LAB — PROTIME-INR
INR: 1.46
PROTHROMBIN TIME: 17.8 s — AB (ref 11.4–15.0)

## 2015-05-19 LAB — BASIC METABOLIC PANEL
ANION GAP: 5 (ref 5–15)
BUN: 15 mg/dL (ref 6–20)
CALCIUM: 9.6 mg/dL (ref 8.9–10.3)
CHLORIDE: 106 mmol/L (ref 101–111)
CO2: 27 mmol/L (ref 22–32)
CREATININE: 0.72 mg/dL (ref 0.44–1.00)
GFR calc non Af Amer: >60 mL/min (ref >60)
Glucose, Bld: 104 mg/dL — ABNORMAL HIGH (ref 65–99)
Potassium: 4.4 mmol/L (ref 3.5–5.1)
SODIUM: 138 mmol/L (ref 135–145)

## 2015-05-19 SURGERY — ABDOMINAL AORTOGRAM W/LOWER EXTREMITY
Wound class: Clean

## 2015-05-19 MED ORDER — ACETAMINOPHEN 325 MG PO TABS: 325.0000 mg | ORAL_TABLET | ORAL | Status: DC | PRN

## 2015-05-19 MED ORDER — ONDANSETRON HCL 4 MG/2ML IJ SOLN: 4.0000 mg | Freq: Four times a day (QID) | INTRAMUSCULAR | Status: DC | PRN

## 2015-05-19 MED ORDER — LIDOCAINE-EPINEPHRINE (PF) 1 %-1:200000 IJ SOLN
INTRAMUSCULAR | Status: DC | PRN
  Administered 2015-05-19: 10 mL via INTRADERMAL

## 2015-05-19 MED ORDER — HEPARIN (PORCINE) IN NACL 2-0.9 UNIT/ML-% IJ SOLN
INTRAMUSCULAR | Status: AC
  Filled 2015-05-19: qty 1000

## 2015-05-19 MED ORDER — FENTANYL CITRATE (PF) 100 MCG/2ML IJ SOLN
INTRAMUSCULAR | Status: DC | PRN
  Administered 2015-05-19: 50 ug via INTRAVENOUS

## 2015-05-19 MED ORDER — ACETAMINOPHEN 325 MG RE SUPP
325.0000 mg | RECTAL | Status: DC | PRN
  Filled 2015-05-19: qty 2

## 2015-05-19 MED ORDER — MIDAZOLAM HCL 2 MG/2ML IJ SOLN
INTRAMUSCULAR | Status: DC | PRN
  Administered 2015-05-19: 1 mg via INTRAVENOUS

## 2015-05-19 MED ORDER — METHYLPREDNISOLONE SODIUM SUCC 125 MG IJ SOLR: 125.0000 mg | INTRAMUSCULAR | Status: DC | PRN

## 2015-05-19 MED ORDER — HYDROMORPHONE HCL 1 MG/ML IJ SOLN: 0.5000 mg | INTRAMUSCULAR | Status: DC | PRN

## 2015-05-19 MED ORDER — DEXTROSE 5 % IV SOLN
1.5000 g | INTRAVENOUS | Status: AC
  Administered 2015-05-19: 1.5 g via INTRAVENOUS

## 2015-05-19 MED ORDER — HEPARIN SODIUM (PORCINE) 1000 UNIT/ML IJ SOLN
INTRAMUSCULAR | Status: DC | PRN
  Administered 2015-05-19: 4000 [IU] via INTRAVENOUS

## 2015-05-19 MED ORDER — HYDROMORPHONE HCL 1 MG/ML IJ SOLN: 1.0000 mg | Freq: Once | INTRAMUSCULAR | Status: DC

## 2015-05-19 MED ORDER — FENTANYL CITRATE (PF) 100 MCG/2ML IJ SOLN
INTRAMUSCULAR | Status: AC
  Filled 2015-05-19: qty 2

## 2015-05-19 MED ORDER — FAMOTIDINE 20 MG PO TABS: 40.0000 mg | ORAL_TABLET | ORAL | Status: DC | PRN

## 2015-05-19 MED ORDER — SODIUM CHLORIDE 0.9 % IV SOLN
INTRAVENOUS | Status: DC
  Administered 2015-05-19: 09:00:00 via INTRAVENOUS

## 2015-05-19 MED ORDER — LIDOCAINE-EPINEPHRINE (PF) 1 %-1:200000 IJ SOLN
INTRAMUSCULAR | Status: AC
  Filled 2015-05-19: qty 30

## 2015-05-19 MED ORDER — LABETALOL HCL 5 MG/ML IV SOLN: 10.0000 mg | INTRAVENOUS | Status: DC | PRN

## 2015-05-19 MED ORDER — SODIUM CHLORIDE 0.9 % IV SOLN: 500.0000 mL | Freq: Once | INTRAVENOUS | Status: DC | PRN

## 2015-05-19 MED ORDER — IOHEXOL 300 MG/ML  SOLN
INTRAMUSCULAR | Status: DC | PRN
  Administered 2015-05-19: 90 mL via INTRA_ARTERIAL

## 2015-05-19 MED ORDER — HEPARIN SODIUM (PORCINE) 1000 UNIT/ML IJ SOLN
INTRAMUSCULAR | Status: AC
  Filled 2015-05-19: qty 1

## 2015-05-19 MED ORDER — HYDRALAZINE HCL 20 MG/ML IJ SOLN: 5.0000 mg | INTRAMUSCULAR | Status: DC | PRN

## 2015-05-19 MED ORDER — METOPROLOL TARTRATE 1 MG/ML IV SOLN
2.0000 mg | INTRAVENOUS | Status: DC | PRN
Start: 2015-05-19 — End: 2015-05-19

## 2015-05-19 MED ORDER — PHENOL 1.4 % MT LIQD: 1.0000 | OROMUCOSAL | Status: DC | PRN

## 2015-05-19 MED ORDER — GUAIFENESIN-DM 100-10 MG/5ML PO SYRP: 15.0000 mL | ORAL_SOLUTION | ORAL | Status: DC | PRN

## 2015-05-19 MED ORDER — MIDAZOLAM HCL 5 MG/5ML IJ SOLN
INTRAMUSCULAR | Status: AC
  Filled 2015-05-19: qty 5

## 2015-05-19 MED ORDER — OXYCODONE-ACETAMINOPHEN 5-325 MG PO TABS: 1.0000 | ORAL_TABLET | ORAL | Status: DC | PRN

## 2015-05-19 SURGICAL SUPPLY — 19 items
BALLN ULTRVRSE 2X220X150 (BALLOONS) ×5
BALLOON ULTRVRSE 2X220X150 (BALLOONS) ×3 IMPLANT
CATH CXI SUPP ANG 4FR 135 (MICROCATHETER) ×3 IMPLANT
CATH CXI SUPP ANG 4FR 135CM (MICROCATHETER) ×5
CATH GLIDE EXCHANG 5FR (CATHETERS) ×5 IMPLANT
CATH PIG 70CM (CATHETERS) ×5 IMPLANT
CATH RIM 65CM (CATHETERS) ×5 IMPLANT
CATH VERT 100CM (CATHETERS) ×5 IMPLANT
DEVICE PRESTO INFLATION (MISCELLANEOUS) ×5 IMPLANT
DEVICE STARCLOSE SE CLOSURE (Vascular Products) ×5 IMPLANT
GLIDEWIRE ADV .035X260CM (WIRE) ×5 IMPLANT
GUIDEWIRE PFTE-COATED .018X300 (WIRE) ×5 IMPLANT
PACK ANGIOGRAPHY (CUSTOM PROCEDURE TRAY) ×5 IMPLANT
SHEATH ANL 5FRX90 (SHEATH) ×5 IMPLANT
SHEATH BRITE TIP 5FRX11 (SHEATH) ×5 IMPLANT
SYR MEDRAD MARK V 150ML (SYRINGE) ×5 IMPLANT
TUBING CONTRAST HIGH PRESS 72 (TUBING) ×5 IMPLANT
WIRE G V18X300CM (WIRE) ×5 IMPLANT
WIRE J 3MM .035X145CM (WIRE) ×5 IMPLANT

## 2015-05-19 NOTE — Op Note (Signed)
[facility] Percutaneous Study/Intervention Procedural Note   Date of Surgery: 05/19/2015  Surgeon(s):DEW,JASON   Assistants:none  Pre-operative Diagnosis: PAD with ulceration left lower extremity  Post-operative diagnosis: Same  Procedure(s) Performed: 1. Ultrasound guidance for vascular access right femoral artery 2. Catheter placement into left peroneal artery and anterior tibial artery from right femoral approach 3. Aortogram and selective left lower extremity angiogram including selective images of the peroneal artery and anterior tibial artery 4. Percutaneous transluminal angioplasty of peroneal artery and tibioperoneal trunk with 2 mm diameter by 22 cm length angioplasty balloon 5. Percutaneous transluminal angioplasty of the distal popliteal artery and entire anterior tibial artery into the foot with 2 mm diameter by 22 cm length angioplasty balloon inflated twice  6.  StarClose closure device right femoral artery  EBL: 25 cc  Contrast: 90 cc  Indications: Patient is a 81 year old female with nonhealing ulceration of the left foot and no palpable pedal pulses. The patient is brought in for angiography for further evaluation and potential treatment. Risks and benefits are discussed and informed consent is obtained  Procedure: The patient was identified and appropriate procedural time out was performed. The patient was then placed supine on the table and prepped and draped in the usual sterile fashion. Ultrasound was used to evaluate the right common femoral artery. It was patent . A digital ultrasound image was acquired. A Seldinger needle was used to access the right common femoral artery under direct ultrasound guidance and a permanent image was performed. A 0.035 J wire was advanced without resistance and a 5Fr sheath was placed. Pigtail catheter was placed into the  aorta and an AP aortogram was performed. This demonstrated normal renal arteries and normal aorta and iliac segments without significant stenosis. I then crossed the aortic bifurcation and advanced to the left femoral head. Selective left lower extremity angiogram was then performed. This demonstrated a small profunda femoris artery but a normal common femoral artery and superficial femoral artery. At the distal popliteal artery there was severe disease with resultant occlusion of the proximal anterior tibial artery with reconstitution distally. The tibioperoneal trunk and peroneal artery then had multiple levels of stenosis and occlusion then occluded distally in the lower calf. The patient was systemically heparinized and a 5 French 90 cm sheath was then placed over the [employer] wire and parked into the popliteal artery on the left. I felt this was necessary due to a very steep aortic bifurcation and a shorter sheath would not of given this good purchase. I then used a Kumpe catheter and the advantage wire to initially navigate into the anterior tibial artery. Selective imaging was performed and I then exchanged for a 0.018 advantage wire. Initially, this was not easy to cross and the wire went down to the peroneal artery. I was able to get a CXI catheter into the mid peroneal artery and confirm intraluminal flow. I then performed angioplasty of the peroneal artery mid to distal segment up through the tibioperoneal trunk with a 2 mm diameter 22 cm length angioplasty balloon. This was inflated to 10 atm for 1 minute. Initially, extravasation was seen and so I repeated inflation with the same balloon to 8 atm for 3 minutes which resulted in no further extravasation and good flow to the peroneal artery to the distal segment where it occluded. This did create better collaterals. At this point, the peroneal artery had been improved as much as possible and the best runoff was still going to be  the anterior  tibial artery. I refocused my efforts on the anterior tibial artery with the CXI catheter and eventually had to use a 0.035 advantage wire to get the catheter across the proximal occlusion. I then exchanged for a V 18 wire parking the wire in the foot. To treat the distal popliteal artery and proximal anterior tibial artery occlusion the 2 mm diameter by 22 cm length angioplasty balloon was inflated from the distal popliteal artery through the proximal anterior tibial artery. It was also another occlusion of the anterior tibial artery just above the ankle and a second inflation with the 2 mm diameter by 22 cm length angioplasty balloon from the dorsalis pedis artery in the mid foot up to the anterior tibial artery in the proximal to mid segment was performed. The distal inflation was to 8 atm and the proximal inflation was to 14 atm. This resulted in markedly improved flow with no significant residual stenosis of more than 30% in the anterior tibial artery and brisk flow into the foot. I elected to terminate the procedure. The sheath was removed and StarClose closure device was deployed in the left femoral artery with excellent hemostatic result. The patient was taken to the recovery room in stable condition having tolerated the procedure well.  Findings:  Aortogram: normal renal arteries, normal aorta and iliac arteries  Left Lower Extremity: Normal common femoral artery, superficial femoral artery with a small profunda femoris artery. Disease at the distal popliteal artery with occlusion of the proximal anterior tibial artery with reconstitution distally and multiple areas of high-grade stenosis and occlusion within the peroneal artery as the runoff distally.   Disposition: Patient was taken to the recovery room in stable condition having tolerated the procedure well.  Complications: None  DEW,JASON 05/19/2015 12:42 PM

## 2015-05-19 NOTE — H&P (Signed)
  [facility] History & Physical Update  The patient was interviewed and re-examined.  The patient's previous History and Physical has been reviewed and is unchanged.  There is no change in the plan of care. We plan to proceed with the scheduled procedure.  Damani Rando, MD  05/19/2015, 9:14 AM

## 2015-05-19 NOTE — Discharge Instructions (Signed)
Angiogram, Care After °Refer to this sheet in the next few weeks. These instructions provide you with information about caring for yourself after your procedure. Your health care provider may also give you more specific instructions. Your treatment has been planned according to current medical practices, but problems sometimes occur. Call your health care provider if you have any problems or questions after your procedure. °WHAT TO EXPECT AFTER THE PROCEDURE °After your procedure, it is typical to have the following: °· Bruising at the catheter insertion site that usually fades within 1-2 weeks. °· Blood collecting in the tissue (hematoma) that may be painful to the touch. It should usually decrease in size and tenderness within 1-2 weeks. °HOME CARE INSTRUCTIONS °· Take medicines only as directed by your health care provider. °· You may shower 24-48 hours after the procedure or as directed by your health care provider. Remove the bandage (dressing) and gently wash the site with plain soap and water. Pat the area dry with a clean towel. Do not rub the site, because this may cause bleeding. °· Do not take baths, swim, or use a hot tub until your health care provider approves. °· Check your insertion site every day for redness, swelling, or drainage. °· Do not apply powder or lotion to the site. °· Do not lift over 10 lb (4.5 kg) for 5 days after your procedure or as directed by your health care provider. °· Ask your health care provider when it is okay to: °¨ Return to work or school. °¨ Resume usual physical activities or sports. °¨ Resume sexual activity. °· Do not drive home if you are discharged the same day as the procedure. Have someone else drive you. °· You may drive 24 hours after the procedure unless otherwise instructed by your health care provider. °· Do not operate machinery or power tools for 24 hours after the procedure or as directed by your health care provider. °· If your procedure was done as an  outpatient procedure, which means that you went home the same day as your procedure, a responsible adult should be with you for the first 24 hours after you arrive home. °· Keep all follow-up visits as directed by your health care provider. This is important. °SEEK MEDICAL CARE IF: °· You have a fever. °· You have chills. °· You have increased bleeding from the catheter insertion site. Hold pressure on the site. °SEEK IMMEDIATE MEDICAL CARE IF: °· You have unusual pain at the catheter insertion site. °· You have redness, warmth, or swelling at the catheter insertion site. °· You have drainage (other than a small amount of blood on the dressing) from the catheter insertion site. °· The catheter insertion site is bleeding, and the bleeding does not stop after 30 minutes of holding steady pressure on the site. °· The area near or just beyond the catheter insertion site becomes pale, cool, tingly, or numb. °  °This information is not intended to replace advice given to you by your health care provider. Make sure you discuss any questions you have with your health care provider. °  °Document Released: 12/10/2004 Document Revised: 06/14/2014 Document Reviewed: 10/25/2012 °Elsevier Interactive Patient Education ©2016 [employer] ° °

## 2015-05-20 ENCOUNTER — Encounter: Payer: Self-pay | Admitting: Vascular Surgery

## 2015-08-25 ENCOUNTER — Ambulatory Visit
Admission: RE | Admit: 2015-08-25 | Discharge: 2015-08-25 | Disposition: A | Discharge status: Home / Self Care | Payer: Medicare Other | Source: Ambulatory Visit | Attending: Vascular Surgery | Admitting: Vascular Surgery

## 2015-08-25 ENCOUNTER — Encounter
Admission: RE | Disposition: A | Discharge status: Home / Self Care | Payer: Self-pay | Source: Ambulatory Visit | Attending: Vascular Surgery

## 2015-08-25 DIAGNOSIS — M810 Age-related osteoporosis without current pathological fracture: Secondary | ICD-10-CM | POA: Insufficient documentation

## 2015-08-25 DIAGNOSIS — I10 Essential (primary) hypertension: Secondary | ICD-10-CM | POA: Diagnosis not present

## 2015-08-25 DIAGNOSIS — Z801 Family history of malignant neoplasm of trachea, bronchus and lung: Secondary | ICD-10-CM | POA: Diagnosis not present

## 2015-08-25 DIAGNOSIS — Z888 Allergy status to other drugs, medicaments and biological substances status: Secondary | ICD-10-CM | POA: Insufficient documentation

## 2015-08-25 DIAGNOSIS — Z8673 Personal history of transient ischemic attack (TIA), and cerebral infarction without residual deficits: Secondary | ICD-10-CM | POA: Diagnosis not present

## 2015-08-25 DIAGNOSIS — I70245 Atherosclerosis of native arteries of left leg with ulceration of other part of foot: Secondary | ICD-10-CM | POA: Insufficient documentation

## 2015-08-25 DIAGNOSIS — K5790 Diverticulosis of intestine, part unspecified, without perforation or abscess without bleeding: Secondary | ICD-10-CM | POA: Insufficient documentation

## 2015-08-25 DIAGNOSIS — Z79899 Other long term (current) drug therapy: Secondary | ICD-10-CM | POA: Diagnosis not present

## 2015-08-25 DIAGNOSIS — Z7901 Long term (current) use of anticoagulants: Secondary | ICD-10-CM | POA: Insufficient documentation

## 2015-08-25 DIAGNOSIS — I482 Chronic atrial fibrillation: Secondary | ICD-10-CM | POA: Diagnosis not present

## 2015-08-25 DIAGNOSIS — L97521 Non-pressure chronic ulcer of other part of left foot limited to breakdown of skin: Secondary | ICD-10-CM | POA: Diagnosis not present

## 2015-08-25 HISTORY — PX: PERIPHERAL VASCULAR CATHETERIZATION: SHX172C

## 2015-08-25 LAB — BASIC METABOLIC PANEL
ANION GAP: 7 (ref 5–15)
BUN: 21 mg/dL — AB (ref 6–20)
CALCIUM: 9.9 mg/dL (ref 8.9–10.3)
CO2: 26 mmol/L (ref 22–32)
CREATININE: 0.71 mg/dL (ref 0.44–1.00)
Chloride: 104 mmol/L (ref 101–111)
GLUCOSE: 107 mg/dL — AB (ref 65–99)
Potassium: 4.5 mmol/L (ref 3.5–5.1)
SODIUM: 137 mmol/L (ref 135–145)

## 2015-08-25 LAB — PROTIME-INR
INR: 1.31
Prothrombin Time: 16.4 seconds — ABNORMAL HIGH (ref 11.4–15.0)

## 2015-08-25 SURGERY — ABDOMINAL AORTOGRAM W/LOWER EXTREMITY
Wound class: Clean

## 2015-08-25 MED ORDER — HEPARIN SODIUM (PORCINE) 1000 UNIT/ML IJ SOLN
INTRAMUSCULAR | Status: DC | PRN
  Administered 2015-08-25: 4000 [IU] via INTRAVENOUS

## 2015-08-25 MED ORDER — LIDOCAINE-EPINEPHRINE (PF) 1 %-1:200000 IJ SOLN
INTRAMUSCULAR | Status: DC | PRN
  Administered 2015-08-25: 10 mL via INTRADERMAL

## 2015-08-25 MED ORDER — MIDAZOLAM HCL 2 MG/2ML IJ SOLN
INTRAMUSCULAR | Status: DC | PRN
  Administered 2015-08-25 (×2): 1 mg via INTRAVENOUS

## 2015-08-25 MED ORDER — FENTANYL CITRATE (PF) 100 MCG/2ML IJ SOLN
INTRAMUSCULAR | Status: AC
  Filled 2015-08-25: qty 2

## 2015-08-25 MED ORDER — IOHEXOL 300 MG/ML  SOLN
INTRAMUSCULAR | Status: DC | PRN
  Administered 2015-08-25: 50 mL via INTRA_ARTERIAL

## 2015-08-25 MED ORDER — LIDOCAINE-EPINEPHRINE (PF) 1 %-1:200000 IJ SOLN
INTRAMUSCULAR | Status: AC
  Filled 2015-08-25: qty 30

## 2015-08-25 MED ORDER — SODIUM CHLORIDE 0.9 % IV SOLN
INTRAVENOUS | Status: DC
  Administered 2015-08-25: 12:00:00 via INTRAVENOUS

## 2015-08-25 MED ORDER — MIDAZOLAM HCL 5 MG/5ML IJ SOLN
INTRAMUSCULAR | Status: AC
  Filled 2015-08-25: qty 5

## 2015-08-25 MED ORDER — HEPARIN (PORCINE) IN NACL 2-0.9 UNIT/ML-% IJ SOLN
INTRAMUSCULAR | Status: AC
  Filled 2015-08-25: qty 1000

## 2015-08-25 MED ORDER — HEPARIN SODIUM (PORCINE) 1000 UNIT/ML IJ SOLN
INTRAMUSCULAR | Status: AC
  Filled 2015-08-25: qty 1

## 2015-08-25 MED ORDER — FENTANYL CITRATE (PF) 100 MCG/2ML IJ SOLN
INTRAMUSCULAR | Status: DC | PRN
  Administered 2015-08-25: 50 ug via INTRAVENOUS
  Administered 2015-08-25: 25 ug via INTRAVENOUS

## 2015-08-25 MED ORDER — DEXTROSE 5 % IV SOLN
1.5000 g | Freq: Once | INTRAVENOUS | Status: AC
  Administered 2015-08-25: 1.5 g via INTRAVENOUS

## 2015-08-25 SURGICAL SUPPLY — 25 items
BALLN ULTRVRSE 3X300X150 (BALLOONS) ×2
BALLN ULTRVRSE 3X300X150 OTW (BALLOONS) ×3
BALLOON ULTRVRSE 3X300X150 OTW (BALLOONS) ×3 IMPLANT
CATH ANGIO 5F 80CM MHK 2 (CATHETERS) ×5 IMPLANT
CATH ANGIO STR 110CM 5SH (CATHETERS) ×5 IMPLANT
CATH CXI 4F 90 DAV (MICROCATHETER) ×5 IMPLANT
CATH CXI SUPP ANG 4FR 135 (MICROCATHETER) ×3 IMPLANT
CATH CXI SUPP ANG 4FR 135CM (MICROCATHETER) ×5
CATH GLIDE EXCHANG 5FR (CATHETERS) ×5 IMPLANT
CATH PIG 70CM (CATHETERS) ×5 IMPLANT
CATH RIM 65CM (CATHETERS) ×5 IMPLANT
CATH VERT 100CM (CATHETERS) ×5 IMPLANT
DEVICE PRESTO INFLATION (MISCELLANEOUS) ×5 IMPLANT
DEVICE STARCLOSE SE CLOSURE (Vascular Products) ×5 IMPLANT
DEVICE TORQUE (MISCELLANEOUS) ×5 IMPLANT
GLIDECATH 4FR STR (CATHETERS) ×5 IMPLANT
GLIDEWIRE ADV .035X260CM (WIRE) ×5 IMPLANT
GLIDEWIRE ANGLED SS 035X260CM (WIRE) ×5 IMPLANT
PACK ANGIOGRAPHY (CUSTOM PROCEDURE TRAY) ×5 IMPLANT
SHEATH ANL 5FRX90 (SHEATH) ×5 IMPLANT
SHEATH BRITE TIP 5FRX11 (SHEATH) ×5 IMPLANT
SYR MEDRAD MARK V 150ML (SYRINGE) ×5 IMPLANT
TUBING CONTRAST HIGH PRESS 72 (TUBING) ×5 IMPLANT
WIRE G V18X300CM (WIRE) ×5 IMPLANT
WIRE J 3MM .035X145CM (WIRE) ×5 IMPLANT

## 2015-08-25 NOTE — Discharge Instructions (Signed)
Angiogram, Care After °Refer to this sheet in the next few weeks. These instructions provide you with information about caring for yourself after your procedure. Your health care provider may also give you more specific instructions. Your treatment has been planned according to current medical practices, but problems sometimes occur. Call your health care provider if you have any problems or questions after your procedure. °WHAT TO EXPECT AFTER THE PROCEDURE °After your procedure, it is typical to have the following: °· Bruising at the catheter insertion site that usually fades within 1-2 weeks. °· Blood collecting in the tissue (hematoma) that may be painful to the touch. It should usually decrease in size and tenderness within 1-2 weeks. °HOME CARE INSTRUCTIONS °· Take medicines only as directed by your health care provider. °· You may shower 24-48 hours after the procedure or as directed by your health care provider. Remove the bandage (dressing) and gently wash the site with plain soap and water. Pat the area dry with a clean towel. Do not rub the site, because this may cause bleeding. °· Do not take baths, swim, or use a hot tub until your health care provider approves. °· Check your insertion site every day for redness, swelling, or drainage. °· Do not apply powder or lotion to the site. °· Do not lift over 10 lb (4.5 kg) for 5 days after your procedure or as directed by your health care provider. °· Ask your health care provider when it is okay to: °¨ Return to work or school. °¨ Resume usual physical activities or sports. °¨ Resume sexual activity. °· Do not drive home if you are discharged the same day as the procedure. Have someone else drive you. °· You may drive 24 hours after the procedure unless otherwise instructed by your health care provider. °· Do not operate machinery or power tools for 24 hours after the procedure or as directed by your health care provider. °· If your procedure was done as an  outpatient procedure, which means that you went home the same day as your procedure, a responsible adult should be with you for the first 24 hours after you arrive home. °· Keep all follow-up visits as directed by your health care provider. This is important. °SEEK MEDICAL CARE IF: °· You have a fever. °· You have chills. °· You have increased bleeding from the catheter insertion site. Hold pressure on the site. °SEEK IMMEDIATE MEDICAL CARE IF: °· You have unusual pain at the catheter insertion site. °· You have redness, warmth, or swelling at the catheter insertion site. °· You have drainage (other than a small amount of blood on the dressing) from the catheter insertion site. °· The catheter insertion site is bleeding, and the bleeding does not stop after 30 minutes of holding steady pressure on the site. °· The area near or just beyond the catheter insertion site becomes pale, cool, tingly, or numb. °  °This information is not intended to replace advice given to you by your health care provider. Make sure you discuss any questions you have with your health care provider. °  °Document Released: 12/10/2004 Document Revised: 06/14/2014 Document Reviewed: 10/25/2012 °Elsevier Interactive Patient Education ©2016 [employer] ° °

## 2015-08-25 NOTE — H&P (Signed)
  [facility] History & Physical Update  The patient was interviewed and re-examined.  The patient's previous History and Physical has been reviewed and is unchanged.  There is no change in the plan of care. We plan to proceed with the scheduled procedure.  DEW,JASON, MD  08/25/2015, 12:37 PM

## 2015-08-25 NOTE — Op Note (Signed)
Ashley Todd Percutaneous Study/Intervention Procedural Note   Date of Surgery: 08/25/2015  Surgeon(s):Ashley Todd   Assistants:none  Pre-operative Diagnosis: PAD with ulceration left lower extremity  Post-operative diagnosis: Same  Procedure(s) Performed: 1. Ultrasound guidance for vascular access right femoral artery 2. Catheter placement into left dorsalis pedis artery from right femoral approach 3. Aortogram and selective left lower extremity angiogram 4. Percutaneous transluminal angioplasty of the anterior tibial artery from the ankle up to its origin off the popliteal artery with 3 mm diameter by 30 cm length angioplasty balloon 5. StarClose closure device right femoral artery  EBL: 25  Contrast: 50 cc  Fluro Time: 16.3 minutes  Moderate Conscious Sedation Time: approximately 75 minutes using 2 mg of Versed and 75 mcg of Fentanyl  Indications: Patient is a 82 y.o.female with nonhealing ulcerations of the left foot and toe area. The patient has noninvasive study showing flat digital waveforms with tibial disease on duplex. The patient is brought in for angiography for further evaluation and potential treatment. Risks and benefits are discussed and informed consent is obtained  Procedure: The patient was identified and appropriate procedural time out was performed. The patient was then placed supine on the table and prepped and draped in the usual sterile fashion.Moderate conscious sedation was administered during a face to face encounter with the patient throughout the procedure with my supervision of the RN administering medicines and monitoring the patient's vital signs, pulse oximetry, telemetry and mental status throughout from the start of the procedure until the patient was taken to the recovery room. Ultrasound was used to evaluate the right common femoral artery. It  was patent . A digital ultrasound image was acquired. A Seldinger needle was used to access the right common femoral artery under direct ultrasound guidance and a permanent image was performed. A 0.035 J wire was advanced without resistance and a 5Fr sheath was placed. Pigtail catheter was placed into the aorta and an AP aortogram was performed. This demonstrated High-grade right renal artery stenosis, no left renal artery stenosis, aorta patent but highly calcified, iliac arteries also without stenosis but markedly tortuous with steep aortic bifurcation . I then crossed the aortic bifurcation and advanced to the left femoral head. This was extremely difficult given her marked tortuosity and steep aortic bifurcation, and multiple catheters and wires ultimately were required with and exchanged to a 5 [place] glide catheter over a floppy Glidewire. I later parked this in the mid SFA to gain better purchase for distal imaging. Selective left lower extremity angiogram was then performed. This demonstrated normal common femoral artery, profunda femoris artery, superficial artery, and popliteal artery. The tibioperoneal trunk was occluded with no distal reconstitution. The anterior tibial artery had a short segment occlusion proximally, another occlusion in the mid segment, and a moderate to high-grade stenosis in the distal segment but was the only runoff to the foot. The patient was systemically heparinized and a long 5 [place] Ansell sheath was then placed over the [employer] wire. I then used a Kumpe catheter and the advantage wire to gain access to the anterior tibial artery. Selective imaging was then performed an I then exchanged for a 135 cm length CXI catheter and a 0.018 wire and advanced into the foot and the dorsalis pedis artery. Intraluminal flow was confirmed. I then replaced the V 18 wire. A 3 mm diameter by 30 cm length angioplasty balloon was inflated from the ankle up across the origin of the  anterior tibial artery into  the popliteal artery. 2 inflations were required for this. The distal inflation was gentle and only to about 3-4 atm. A more proximal inflation was taken to 10 atm. Both inflations were held for approximately 1 minute. Completion angiogram following these angioplasty showed no greater than 20% residual stenosis with brisk flow into the foot. I elected to terminate the procedure. The sheath was removed and StarClose closure device was deployed in the left femoral artery with excellent hemostatic result. The patient was taken to the recovery room in stable condition having tolerated the procedure well.  Findings:  Aortogram: High-grade right renal artery stenosis, no left renal artery stenosis, aorta patent but highly calcified, iliac arteries also without stenosis but markedly tortuous with steep aortic bifurcation Left Lower Extremity: This demonstrated normal common femoral artery, profunda femoris artery, superficial artery, and popliteal artery. The tibioperoneal trunk was occluded with no distal reconstitution. The anterior tibial artery had a short segment occlusion proximally, another occlusion in the mid segment, and a moderate to high-grade stenosis in the distal segment but was the only runoff to the foot.   Disposition: Patient was taken to the recovery room in stable condition having tolerated the procedure well.  Complications: None  Ashley Todd 08/25/2015 2:15 PM

## 2015-08-26 ENCOUNTER — Encounter: Payer: Self-pay | Admitting: Vascular Surgery

## 2015-10-06 DEATH — deceased
